# Patient Record
Sex: Male | Born: 1995 | Race: White | Hispanic: No | Marital: Single | Smoking: Current every day smoker
Health system: Southern US, Community
[De-identification: ages and names within clinical notes are randomized; demographics above are authoritative.]

## PROBLEM LIST (undated history)

## (undated) DIAGNOSIS — R1110 Vomiting, unspecified: Secondary | ICD-10-CM

## (undated) DIAGNOSIS — G2569 Other tics of organic origin: Secondary | ICD-10-CM

## (undated) DIAGNOSIS — F319 Bipolar disorder, unspecified: Secondary | ICD-10-CM

## (undated) DIAGNOSIS — G40909 Epilepsy, unspecified, not intractable, without status epilepticus: Secondary | ICD-10-CM

## (undated) HISTORY — DX: Other tics of organic origin: G25.69

## (undated) HISTORY — DX: Vomiting, unspecified: R11.10

## (undated) HISTORY — PX: CIRCUMCISION: SHX1350

---
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
## 2013-02-08 ENCOUNTER — Ambulatory Visit (INDEPENDENT_AMBULATORY_CARE_PROVIDER_SITE_OTHER): Payer: Medicaid Other | Admitting: Pediatrics

## 2013-02-08 ENCOUNTER — Encounter: Payer: Self-pay | Admitting: Pediatrics

## 2013-02-08 VITALS — BP 100/76 | HR 72 | Ht 68.25 in | Wt 114.8 lb

## 2013-02-08 DIAGNOSIS — F329 Major depressive disorder, single episode, unspecified: Secondary | ICD-10-CM

## 2013-02-08 DIAGNOSIS — F32A Depression, unspecified: Secondary | ICD-10-CM

## 2013-02-08 DIAGNOSIS — G40309 Generalized idiopathic epilepsy and epileptic syndromes, not intractable, without status epilepticus: Secondary | ICD-10-CM

## 2013-02-08 DIAGNOSIS — R269 Unspecified abnormalities of gait and mobility: Secondary | ICD-10-CM

## 2013-02-08 DIAGNOSIS — F3289 Other specified depressive episodes: Secondary | ICD-10-CM

## 2013-02-08 MED ORDER — LAMOTRIGINE 100 MG PO TABS: 200.0000 mg | ORAL_TABLET | Freq: Two times a day (BID) | ORAL | Status: DC

## 2013-02-08 MED ORDER — LEVETIRACETAM 1000 MG PO TABS: ORAL_TABLET | ORAL | Status: DC

## 2013-02-08 NOTE — Progress Notes (Signed)
Patient: Andrew Everett MRN: [id number] Sex: male DOB: 07/18/1995  Provider: Deetta Perla, MD Location of Care: [facility] Child Neurology  Note type: Routine return visit  History of Present Illness: Referral Source: Dr. Joellen Jersey. Jenne Pane History from: mother, patient and [facility] chart Chief Complaint: Tics/Epilepsy/ADHD  Andrew Ryle is a 18 y.o. male who returns for evaluation and management of motor tics, seizures, and attention deficit disorder.  The patient was seen on February 08, 2013.  His last visit was on August 08, 2012.  He has a history of generalized convulsive seizures associated with jerking of his limbs, deep breathing, eyelids open, eyes rolled up, tachycardia, teeth grinding, and vomiting.  They last 5 to 6 minutes and he has postictal stupor of 15 minutes.  His last known seizure was in early December 2013.  The patient has continued to lose weight since his last visit.  He is down another 4.2 pounds.  He said that he is no longer living with his mother and that he lives with people who are poor and there is little food to eat.  Mother lost her food stamps and so she is having difficulty making ends meet.  I had her leave the room, so that I could talk to him.  He tells me that he is depressed and beginning to get in a place where he thinks about hurting himself.  He has no plan at this time.  I urged him to go back and see his psychiatrist.  I told him, he did not need to be hospitalized.  He was very frightened of that.  He smokes about four cigarettes a day, smokes marijuana at least weekly and once a week has a potent 16-ounce alcoholic drink called Loco.  He is no longer taking Zyprexa, Depade, or Prozac, although he told me that he was.  He claims that he is taking lamotrigine and levetiracetam and said that he wanted to come off the medications.  I told him that it would be unwise and that he would likely have recurrent seizures.  He has not been educated beyond the 9th grade.   He went to [employer] classes, but was bored and quit them.  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  He is clearly not able to support himself.  He tells me that he is getting ready to move to [place].  I do not think he has any idea where he is going to go or stay.  The other big problem is that he has Medicaid here, and will have to establish residency in [place].  It is highly likely that he will go without medication and wind up having recurrent seizures.  I pointed this out to him and told him to think it through very carefully before he left.  Review of Systems: 12 system review was remarkable for trouble swallowing, increase thirst, headache, depression, anxiety and racing thoughts  Past Medical History  Diagnosis Date  . Epilepsy   . Bipolar disorder   . Vomiting   . Constipation   . Seizure disorder   . Tics of organic origin    Hospitalizations: yes, Head Injury: no, Nervous System Infections: no, Immunizations up to date: yes Past Medical History Comments: See surgical Hx for hospitalizations.  Birth History 6 lbs. 14 oz. Infant born at [redacted] weeks gestational age Gestation was uncomplicated Mother received Pitocin normal spontaneous vaginal delivery Nursery Course was complicated by by mild jaundice. Growth and Development was  recalled as  normal  Behavior History Difficult to discipline, becomes upset easily, difficulty sleeping, destructive, prefers to be alone, unusual difficulty with brothers and sisters.  Surgical History Past Surgical History  Procedure Laterality Date  . Circumcision    . Wrist surgery Left 2013    Repair left broken wrist    Family History family history includes Bipolar disorder in his mother; COPD in his maternal grandmother; Diabetes in his maternal grandmother; Hepatitis C in his maternal grandmother. Family History is negative migraines, seizures, cognitive impairment, blindness, deafness, birth defects, chromosomal  disorder, autism.  Social History History   Social History  . Marital Status: Single    Spouse Name: N/A    Number of Children: N/A  . Years of Education: N/A   Social History Main Topics  . Smoking status: Current Every Day Smoker  . Smokeless tobacco: Never Used     Comment: Patient smokes 4 cigaretts daily  . Alcohol Use: 8.0 oz/week    16 drink(s) per week     Comment: Patient had 1 alcoholic beverage in Jan. 2015.  . Drug Use: 5.00 per week    Special: Marijuana, Other-see comments     Comment: Prescription Cold Tablets- Patient stated he no longer takes cold tabs as a means of getting high. He admitted to smoking Marijuana once in Jan. 2015  . Sexual Activity: Yes    Partners: Female   Other Topics Concern  . None   Social History Narrative  . None   Educational level 9th grade Living with his girlfriend  Hobbies/Interest: Watching TV and texting on his phone. School comments [place] dropped out of school his 9th grade year, he was attending [employer].   Current Outpatient Prescriptions on File Prior to Visit  Medication Sig Dispense Refill  . lamoTRIgine (LAMICTAL) 100 MG tablet Take 2 tablets (200 mg total) by mouth 2 (two) times daily.  124 tablet  5  . levETIRAcetam (KEPPRA) 1000 MG tablet Take 2 tablets (2,000 mg total) by mouth 2 (two) times daily.  124 tablet  5   No current facility-administered medications on file prior to visit.   The medication list was reviewed and reconciled. All changes or newly prescribed medications were explained.  A complete medication list was provided to the patient/caregiver.  Allergies  Allergen Reactions  . Aripiprazole Anaphylaxis    And seizure  . Risperdal [Risperidone] Anaphylaxis  . Seroquel [Quetiapine Fumerate] Anaphylaxis    Physical Exam BP 100/76  Pulse 72  Ht 5' 8.25" (1.734 m)  Wt 114 lb 12.8 oz (52.073 kg)  BMI 17.32 kg/m2  General: alert, well developed, thin, dishevelled, in no  acute distress, dyed pink hair, brown eyes, right handed  Head: normocephalic, no dysmorphic features  Ears, Nose and Throat: Otoscopic: Tympanic membranes normal. Pharynx: oropharynx is pink without exudates or tonsillar hypertrophy.  Neck: supple, full range of motion, no cranial or cervical bruits  Respiratory: auscultation clear  Cardiovascular: no murmurs, pulses are normal  Musculoskeletal: no skeletal deformities or apparent scoliosis  Skin: no rashes or neurocutaneous lesions, significant facial acne   Neurologic Exam   Mental Status: alert; oriented to person, place and year; knowledge is normal for age; language is normal, Subdued, flat affect, poor eye contact. He was responsive when I asked him questions and cooperative during the examination.  Cranial Nerves: visual fields are full to double simultaneous stimuli; extraocular movements are full and conjugate; pupils are around reactive to light; funduscopic examination  shows sharp disc margins with normal vessels; symmetric facial strength; midline tongue and uvula; air conduction is greater than bone conduction bilaterally.  Motor: Normal strength, tone and mass; good fine motor movements; no pronator drift.  Sensory: intact responses to cold, vibration, proprioception and stereognosis  Coordination: good finger-to-nose, rapid repetitive alternating movements and finger apposition  Gait and Station: normal gait and station: patient is able to walk on heels, toes and tandem without difficulty; balance is adequate; Romberg exam is negative; Gower response is negative  Reflexes: symmetric and diminished bilaterally; no clonus; bilateral flexor plantar responses.  Assessment 1. 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 2. Gait abnormality (781.2). 3. Depression (311).  Discussion I am pleased that his seizures remain in control.  It was very difficult to get there.  I am afraid that he will do something  impulsive and that his seizures will recur.   I am concerned about his poor nutrition.  I think that he is way past beyond returning to his mother's home to stay.   Finally, I am concerned about his depression, although he has been off medication since when I last saw him and he has not had any significant evidence of suicidal gestures.   I think that his plan to move to [place] is ill-conceived.  In his attempt to find a new life, I am afraid that he is going to find the old life, which was certainly problematic and is better because of the financial and emotional supports that he has here.  If he remains, I will see him in six months' time, sooner depending upon clinical need.    I spent 30 minutes of face-to-face time with Andrew and his mother, more than half of it in consultation.  Deetta Perla MD

## 2013-12-26 ENCOUNTER — Encounter (HOSPITAL_COMMUNITY): Payer: Self-pay | Admitting: Emergency Medicine

## 2013-12-26 ENCOUNTER — Emergency Department (INDEPENDENT_AMBULATORY_CARE_PROVIDER_SITE_OTHER)
Admission: EM | Admit: 2013-12-26 | Discharge: 2013-12-26 | Disposition: A | Discharge status: Home / Self Care | Payer: Medicaid Other | Source: Home / Self Care | Attending: Family Medicine | Admitting: Family Medicine

## 2013-12-26 DIAGNOSIS — J020 Streptococcal pharyngitis: Secondary | ICD-10-CM

## 2013-12-26 MED ORDER — AMOXICILLIN 500 MG PO CAPS: 1000.0000 mg | ORAL_CAPSULE | Freq: Two times a day (BID) | ORAL | Status: DC

## 2013-12-26 MED ORDER — ACETAMINOPHEN 325 MG PO TABS
ORAL_TABLET | ORAL | Status: AC
  Filled 2013-12-26: qty 3

## 2013-12-26 MED ORDER — ACETAMINOPHEN 325 MG PO TABS
975.0000 mg | ORAL_TABLET | Freq: Once | ORAL | Status: AC
  Administered 2013-12-26: 975 mg via ORAL

## 2013-12-26 NOTE — ED Provider Notes (Signed)
[place] Keidel is a 18 y.o. male who presents to Urgent Care today for sore throat. Patient has significant sore throat and fever. Symptoms started today. No vomiting cough congestion chest pains or palpitations. Patient has tried Robitussin which helps some.   Past Medical History  Diagnosis Date  . Epilepsy   . Bipolar disorder   . Vomiting   . Constipation   . Seizure disorder   . Tics of organic origin    Past Surgical History  Procedure Laterality Date  . Circumcision    . Wrist surgery Left 2013    Repair left broken wrist   History  Substance Use Topics  . Smoking status: Current Every Day Smoker  . Smokeless tobacco: Never Used     Comment: Patient smokes 4 cigaretts daily  . Alcohol Use: 8.0 oz/week    16 drink(s) per week     Comment: Patient had 1 alcoholic beverage in Jan. 2015.   ROS as above Medications: No current facility-administered medications for this encounter.   Current Outpatient Prescriptions  Medication Sig Dispense Refill  . amoxicillin (AMOXIL) 500 MG capsule Take 2 capsules (1,000 mg total) by mouth 2 (two) times daily. 40 capsule 0  . lamoTRIgine (LAMICTAL) 100 MG tablet Take 2 tablets (200 mg total) by mouth 2 (two) times daily. 124 tablet 5  . levETIRAcetam (KEPPRA) 1000 MG tablet 2 by mouth twice a day 124 tablet 5   Allergies  Allergen Reactions  . Aripiprazole Anaphylaxis    And seizure  . Risperdal [Risperidone] Anaphylaxis  . Seroquel [Quetiapine Fumerate] Anaphylaxis     Exam:  BP 166/62 mmHg  Pulse 90  Temp(Src) 101 F (38.3 C) (Oral)  Resp 18  SpO2 95% Gen: Well NAD nontoxic appearing HEENT: EOMI,  MMM posterior pharynx is erythematous with exudate. Normal tympanic membranes bilaterally. Lungs: Normal work of breathing. CTABL Heart: RRR no MRG Abd: NABS, Soft. Nondistended, Nontender Exts: Brisk capillary refill, warm and well perfused.   No results found for this or any previous visit (from the past 24 hour(s)). No  results found.  Assessment and Plan: 18 y.o. male with strep throat. Treatment with amoxicillin. Continue Tylenol or ibuprofen as needed.  Discussed warning signs or symptoms. Please see discharge instructions. Patient expresses understanding.     Rodolph BongEvan S Amato Sevillano, MD 12/26/13 [phone]

## 2013-12-26 NOTE — Discharge Instructions (Signed)
Thank you for coming in today. Take tylenol for pain and fever.  Strep Throat Strep throat is an infection of the throat caused by a bacteria named Streptococcus pyogenes. Your health care provider may call the infection streptococcal "tonsillitis" or "pharyngitis" depending on whether there are signs of inflammation in the tonsils or back of the throat. Strep throat is most common in children aged 20-15 years during the cold months of the year, but it can occur in people of any age during any season. This infection is spread from person to person (contagious) through coughing, sneezing, or other close contact. SIGNS AND SYMPTOMS   Fever or chills.  Painful, swollen, red tonsils or throat.  Pain or difficulty when swallowing.  White or yellow spots on the tonsils or throat.  Swollen, tender lymph nodes or "glands" of the neck or under the jaw.  Red rash all over the body (rare). DIAGNOSIS  Many different infections can cause the same symptoms. A test must be done to confirm the diagnosis so the right treatment can be given. A "rapid strep test" can help your health care provider make the diagnosis in a few minutes. If this test is not available, a light swab of the infected area can be used for a throat culture test. If a throat culture test is done, results are usually available in a day or two. TREATMENT  Strep throat is treated with antibiotic medicine. HOME CARE INSTRUCTIONS   Gargle with 1 tsp of salt in 1 cup of warm water, 3-4 times per day or as needed for comfort.  Family members who also have a sore throat or fever should be tested for strep throat and treated with antibiotics if they have the strep infection.  Make sure everyone in your household washes their hands well.  Do not share food, drinking cups, or personal items that could cause the infection to spread to others.  You may need to eat a soft food diet until your sore throat gets better.  Drink enough water and  fluids to keep your urine clear or pale yellow. This will help prevent dehydration.  Get plenty of rest.  Stay home from school, day care, or work until you have been on antibiotics for 24 hours.  Take medicines only as directed by your health care provider.  Take your antibiotic medicine as directed by your health care provider. Finish it even if you start to feel better. SEEK MEDICAL CARE IF:   The glands in your neck continue to enlarge.  You develop a rash, cough, or earache.  You cough up green, yellow-brown, or bloody sputum.  You have pain or discomfort not controlled by medicines.  Your problems seem to be getting worse rather than better.  You have a fever. SEEK IMMEDIATE MEDICAL CARE IF:   You develop any new symptoms such as vomiting, severe headache, stiff or painful neck, chest pain, shortness of breath, or trouble swallowing.  You develop severe throat pain, drooling, or changes in your voice.  You develop swelling of the neck, or the skin on the neck becomes red and tender.  You develop signs of dehydration, such as fatigue, dry mouth, and decreased urination.  You become increasingly sleepy, or you cannot wake up completely. MAKE SURE YOU:  Understand these instructions.  Will watch your condition.  Will get help right away if you are not doing well or get worse. Document Released: 01/10/2000 Document Revised: 05/29/2013 Document Reviewed: 03/13/2010 Exit[employer] Patient Information 2015 [employer]. This information is not intended to replace advice given to you by your health care provider. Make sure you discuss any questions you have with your health care provider.

## 2013-12-26 NOTE — ED Notes (Signed)
Pt states that he has had a sore throat since this morning with some ear pain. Pt is in no acute distress at this time.

## 2014-03-05 ENCOUNTER — Telehealth: Payer: Self-pay

## 2014-03-05 NOTE — Telephone Encounter (Addendum)
Mother called, scheduled appt w Dr. Rexene EdisonH for 03/16/14 @ 9:30 am. Mother could not tell me which medication he is currently taking. [place] use [facility] [place]. She said that he takes 2 tabs in the morning and 1 in the evening. Andrew Everett has not been ill or missed any medication doses that she is aware of. Andrew Everett had 2 szs within the past 2 weeks. The most recent sz was last night. She was able to capture it on video. He was sitting and watching television with mother. Sz lasted 3 mins and included bilateral arms/upper torso jerking, eyes rolled into the back of his head and he was unresponsive.  After the sz, he was "walking around the house aimlessly" and was nonresponsive. Ten minutes after the sz ended, he experienced headache and vomited once. He then continued watching television with mother. Andrew Everett is aware that Dr.H is not in the office today. I let her know that she could expect a call back from one of the other providers in the office. She expressed understanding and can be reached at [phone].

## 2014-03-05 NOTE — Telephone Encounter (Signed)
Andrew Everett, mom, lvm stating that Andrew Everett has been having an increase in sz activity for several weeks. She said that she would like to schedule a follow up with Dr. Rexene EdisonH for Andrew Everett. She would like a Friday appt so that she can bring him.  I tried calling mother back at the number she [phone]. I lvm asking that she call me back, gave her my extension.

## 2014-03-05 NOTE — Telephone Encounter (Signed)
I have attempted to call Mom several times this afternoon but each time received a fast busy signal. In reviewing Andrew Everett's chart, it does not appear that the medication doses that Mom quoted to Tammy (below) is correct, nor does it appear that his medication has been refilled since January 2015. I will try to call Mom again tomorrow. TG

## 2014-03-06 NOTE — Telephone Encounter (Signed)
I have tried to call Mom again today and continue to receive a fast busy signal when I call. I will mail a letter. TG

## 2014-03-07 NOTE — Telephone Encounter (Signed)
Thank you, I agree with this plan.

## 2014-03-16 ENCOUNTER — Ambulatory Visit (INDEPENDENT_AMBULATORY_CARE_PROVIDER_SITE_OTHER): Payer: Medicaid Other | Admitting: Pediatrics

## 2014-03-16 ENCOUNTER — Encounter: Payer: Self-pay | Admitting: Pediatrics

## 2014-03-16 VITALS — BP 109/70 | HR 90 | Ht 68.5 in | Wt 117.2 lb

## 2014-03-16 DIAGNOSIS — R269 Unspecified abnormalities of gait and mobility: Secondary | ICD-10-CM

## 2014-03-16 DIAGNOSIS — G40309 Generalized idiopathic epilepsy and epileptic syndromes, not intractable, without status epilepticus: Secondary | ICD-10-CM

## 2014-03-16 DIAGNOSIS — F32A Depression, unspecified: Secondary | ICD-10-CM

## 2014-03-16 DIAGNOSIS — F329 Major depressive disorder, single episode, unspecified: Secondary | ICD-10-CM

## 2014-03-16 MED ORDER — LEVETIRACETAM 1000 MG PO TABS: ORAL_TABLET | ORAL | Status: DC

## 2014-03-16 MED ORDER — LAMOTRIGINE 100 MG PO TABS: 200.0000 mg | ORAL_TABLET | Freq: Two times a day (BID) | ORAL | Status: DC
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
## 2014-04-23 IMAGING — CR DG ABDOMEN 1V
1 series · 1 of 1 positions shown · non-contrast
Comparison: 04/09/2011

CLINICAL DATA: Left lower abdominal pain.  Irregular bowel
movements.

ABDOMEN - 1 VIEW

[t abdomen supine]
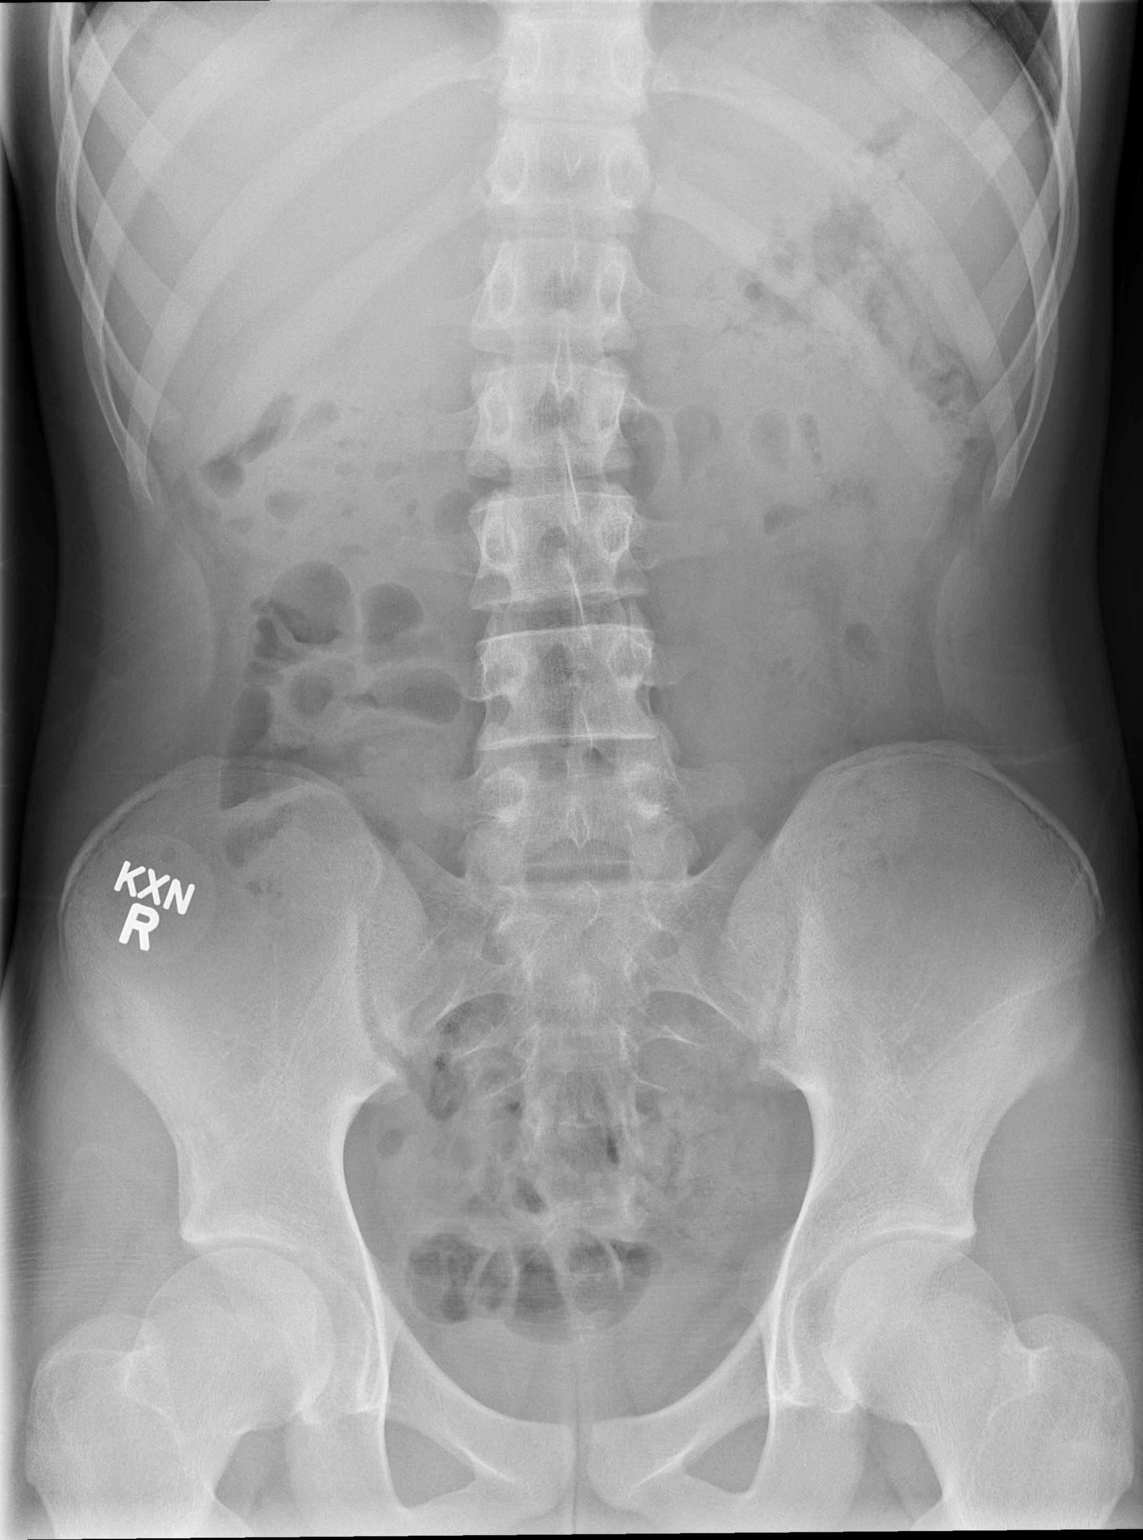

[1 of 1 positions shown; findings below may reference images not displayed]

FINDINGS: Bowel gas pattern is normal without evidence of ileus or
obstruction.  Normal amount of fecal matter.  No worrisome
calcifications.  Minimal spinal curvature.
IMPRESSION: No evidence of ileus, obstruction or constipation by radiography.
Mild spinal curvature.

## 2014-11-01 ENCOUNTER — Encounter (HOSPITAL_COMMUNITY): Payer: Self-pay | Admitting: *Deleted

## 2014-11-01 ENCOUNTER — Emergency Department (INDEPENDENT_AMBULATORY_CARE_PROVIDER_SITE_OTHER)
Admission: EM | Admit: 2014-11-01 | Discharge: 2014-11-01 | Disposition: A | Discharge status: Home / Self Care | Payer: Medicaid Other | Source: Home / Self Care | Attending: Family Medicine | Admitting: Family Medicine

## 2014-11-01 DIAGNOSIS — L0100 Impetigo, unspecified: Secondary | ICD-10-CM | POA: Diagnosis not present

## 2014-11-01 DIAGNOSIS — L200 Besnier's prurigo: Secondary | ICD-10-CM | POA: Diagnosis not present

## 2014-11-01 DIAGNOSIS — L239 Allergic contact dermatitis, unspecified cause: Secondary | ICD-10-CM

## 2014-11-01 MED ORDER — PREDNISONE 10 MG (48) PO TBPK: ORAL_TABLET | Freq: Every day | ORAL | Status: DC

## 2014-11-01 MED ORDER — MUPIROCIN 2 % EX OINT: 1.0000 "application " | TOPICAL_OINTMENT | Freq: Two times a day (BID) | CUTANEOUS | Status: DC

## 2014-11-01 NOTE — ED Provider Notes (Addendum)
CSN: [id number]     Arrival date & time 11/01/14  1425 History   First MD Initiated Contact with Patient 11/01/14 1548     Chief Complaint  Patient presents with  . Rash   (Consider location/radiation/quality/duration/timing/severity/associated sxs/prior Treatment) HPI  Rash developed 7 days ago. Sleeps a station in house. Also bombed a couple days prior to onset of symptoms. Patient had direct visual contact with fleas biting his legs. Very initially itchy. Patient has scratch the area which is made the problem worse. Patient has tried over-the-counter cortisone cream without improvement. Some pain with open sores. Crusting of the skin. Problem is constant and getting worse.    Past Medical History  Diagnosis Date  . Epilepsy (HCC)   . Bipolar disorder (HCC)   . Vomiting   . Constipation   . Seizure disorder (HCC)   . Tics of organic origin    Past Surgical History  Procedure Laterality Date  . Circumcision    . Wrist surgery Left 2013    Repair left broken wrist   Family History  Problem Relation Age of Onset  . Bipolar disorder Mother   . Diabetes Maternal Grandmother   . Hepatitis C Maternal Grandmother   . COPD Maternal Grandmother    Social History  Substance Use Topics  . Smoking status: Current Every Day Smoker    Types: Cigarettes  . Smokeless tobacco: Never Used     Comment: Patient smokes 4 cigaretts daily  . Alcohol Use: No     Comment: Patient had 1 alcoholic beverage in Jan. 2015.    Review of Systems Per HPI with all other pertinent systems negative.   Allergies  Aripiprazole; Risperdal; and Seroquel  Home Medications   Prior to Admission medications   Medication Sig Start Date End Date Taking? Authorizing Provider  lamoTRIgine (LAMICTAL) 100 MG tablet Take 2 tablets (200 mg total) by mouth 2 (two) times daily. 03/16/14  Yes Deetta Perla, MD  levETIRAcetam (KEPPRA) 1000 MG tablet 2 by mouth twice a day 03/16/14  Yes Deetta Perla, MD   mupirocin ointment (BACTROBAN) 2 % Apply 1 application topically 2 (two) times daily. 11/01/14   Ozella Rocks, MD  predniSONE (STERAPRED UNI-PAK 48 TAB) 10 MG (48) TBPK tablet Take by mouth daily. Take as instructed 11/01/14   Ozella Rocks, MD   Meds Ordered and Administered this Visit  Medications - No data to display  BP 118/74 mmHg  Pulse 79  Temp(Src) 98.3 F (36.8 C) (Oral)  Resp 16  SpO2 99% No data found.   Physical Exam Physical Exam  Constitutional: oriented to person, place, and time. appears well-developed and well-nourished. No distress.  HENT:  Head: Normocephalic and atraumatic.  Eyes: EOMI. PERRL.  Neck: Normal range of motion.  Cardiovascular: RRR, no m/r/g, 2+ distal pulses,  Pulmonary/Chest: Effort normal and breath sounds normal. No respiratory distress.  Abdominal: Soft. Bowel sounds are normal. NonTTP, no distension.  Musculoskeletal: Normal range of motion. Non ttp, no effusion.  Neurological: alert and oriented to person, place, and time.  Skin: Lower extremity erythematous, macular, papular lesions with areas of crusting.  Psychiatric: normal mood and affect. behavior is normal. Judgment and thought content normal.   ED Course  Procedures (including critical care time)  Labs Review Labs Reviewed - No data to display  Imaging Review No results found.   Visual Acuity Review  Right Eye Distance:   Left Eye Distance:   Bilateral Distance:  Right Eye Near:   Left Eye Near:    Bilateral Near:         MDM   1. Allergic dermatitis   2. Impetigo    Prednisone dose pack, bactroban ointment.    Ozella Rocks, MD 11/01/14 1616  Ozella Rocks, MD 11/01/14 [phone]

## 2014-11-01 NOTE — ED Notes (Signed)
Red, raised itchy rash on left lower leg

## 2014-11-01 NOTE — Discharge Instructions (Signed)
You have a mild bacterial infection of the skin. This is caused by breakdown of the skin from irritation and inflammation. Please use the steroids as prescribed to help with the allergic reaction and use the ointment as prescribed for the infection. Please call if symptoms get worse.

## 2015-01-04 ENCOUNTER — Emergency Department (HOSPITAL_COMMUNITY)
Admission: EM | Admit: 2015-01-04 | Discharge: 2015-01-05 | Disposition: A | Discharge status: Home / Self Care | Payer: Medicaid Other | Attending: Emergency Medicine | Admitting: Emergency Medicine

## 2015-01-04 ENCOUNTER — Encounter (HOSPITAL_COMMUNITY): Payer: Self-pay | Admitting: Adult Health

## 2015-01-04 DIAGNOSIS — G40909 Epilepsy, unspecified, not intractable, without status epilepticus: Secondary | ICD-10-CM | POA: Diagnosis not present

## 2015-01-04 DIAGNOSIS — F319 Bipolar disorder, unspecified: Secondary | ICD-10-CM | POA: Diagnosis not present

## 2015-01-04 DIAGNOSIS — N50812 Left testicular pain: Secondary | ICD-10-CM

## 2015-01-04 DIAGNOSIS — N451 Epididymitis: Secondary | ICD-10-CM | POA: Insufficient documentation

## 2015-01-04 DIAGNOSIS — Z79899 Other long term (current) drug therapy: Secondary | ICD-10-CM | POA: Diagnosis not present

## 2015-01-04 DIAGNOSIS — N5089 Other specified disorders of the male genital organs: Secondary | ICD-10-CM

## 2015-01-04 DIAGNOSIS — F17210 Nicotine dependence, cigarettes, uncomplicated: Secondary | ICD-10-CM | POA: Diagnosis not present

## 2015-01-04 DIAGNOSIS — K920 Hematemesis: Secondary | ICD-10-CM | POA: Insufficient documentation

## 2015-01-04 NOTE — ED Notes (Signed)
Presents with hematemesis for 2 weeks, intermittently. Also c/o testicular pain for one month and left sided lump in testicle. Endorses yellow ejaculation. Endorses pain with urination.

## 2015-01-05 ENCOUNTER — Emergency Department (HOSPITAL_COMMUNITY): Payer: Medicaid Other

## 2015-01-05 LAB — URINALYSIS, ROUTINE W REFLEX MICROSCOPIC
Bilirubin Urine: NEGATIVE
Glucose, UA: NEGATIVE mg/dL
Hgb urine dipstick: NEGATIVE
KETONES UR: NEGATIVE mg/dL
LEUKOCYTES UA: NEGATIVE
Nitrite: NEGATIVE
Protein, ur: NEGATIVE mg/dL
Specific Gravity, Urine: 1.02 (ref 1.005–1.030)
pH: 7 (ref 5.0–8.0)

## 2015-01-05 LAB — I-STAT CHEM 8, ED
BUN: 5 mg/dL — ABNORMAL LOW (ref 6–20)
CHLORIDE: 99 mmol/L — AB (ref 101–111)
Calcium, Ion: 1.13 mmol/L (ref 1.12–1.23)
Creatinine, Ser: 1 mg/dL (ref 0.61–1.24)
GLUCOSE: 129 mg/dL — AB (ref 65–99)
HEMATOCRIT: 46 % (ref 39.0–52.0)
Hemoglobin: 15.6 g/dL (ref 13.0–17.0)
POTASSIUM: 3.9 mmol/L (ref 3.5–5.1)
SODIUM: 140 mmol/L (ref 135–145)
TCO2: 29 mmol/L (ref 0–100)

## 2015-01-05 LAB — POC OCCULT BLOOD, ED: Fecal Occult Bld: NEGATIVE

## 2015-01-05 MED ORDER — LEVOFLOXACIN 500 MG PO TABS
500.0000 mg | ORAL_TABLET | Freq: Once | ORAL | Status: AC
  Administered 2015-01-05: 500 mg via ORAL
  Filled 2015-01-05: qty 1

## 2015-01-05 MED ORDER — HYDROCODONE-ACETAMINOPHEN 5-325 MG PO TABS
2.0000 | ORAL_TABLET | Freq: Once | ORAL | Status: AC
Start: 2015-01-05 — End: 2015-01-05
  Administered 2015-01-05: 2 via ORAL
  Filled 2015-01-05: qty 2

## 2015-01-05 MED ORDER — CEFTRIAXONE SODIUM 250 MG IJ SOLR
250.0000 mg | Freq: Once | INTRAMUSCULAR | Status: AC
  Administered 2015-01-05: 250 mg via INTRAMUSCULAR
  Filled 2015-01-05: qty 250

## 2015-01-05 MED ORDER — PANTOPRAZOLE SODIUM 40 MG PO TBEC
40.0000 mg | DELAYED_RELEASE_TABLET | Freq: Once | ORAL | Status: AC
  Administered 2015-01-05: 40 mg via ORAL
  Filled 2015-01-05: qty 1

## 2015-01-05 MED ORDER — DOXYCYCLINE HYCLATE 100 MG PO TABS: 100.0000 mg | ORAL_TABLET | Freq: Once | ORAL | Status: DC

## 2015-01-05 MED ORDER — LEVOFLOXACIN 500 MG PO TABS: 500.0000 mg | ORAL_TABLET | Freq: Every day | ORAL | Status: DC

## 2015-01-05 MED ORDER — PROMETHAZINE HCL 25 MG PO TABS: 25.0000 mg | ORAL_TABLET | Freq: Four times a day (QID) | ORAL | Status: AC | PRN

## 2015-01-05 MED ORDER — HYDROCODONE-ACETAMINOPHEN 5-325 MG PO TABS: 1.0000 | ORAL_TABLET | ORAL | Status: DC | PRN

## 2015-01-05 MED ORDER — PANTOPRAZOLE SODIUM 40 MG PO TBEC: 40.0000 mg | DELAYED_RELEASE_TABLET | Freq: Every day | ORAL | Status: DC

## 2015-01-05 MED ORDER — LIDOCAINE HCL (PF) 1 % IJ SOLN
INTRAMUSCULAR | Status: AC
  Filled 2015-01-05: qty 5

## 2015-01-05 NOTE — Discharge Instructions (Signed)
Please stop drinking alcohol and avoid NSAIDs such as aspirin, ibuprofen, Aleve. I recommend that if you continue to have episodes or you have blood strict vomit that you follow-up with a gastroenterologist as you will likely need an endoscopy. Please call this weekend schedule an appointment. Also were found to have epididymitis which is an infection and could be related to sexual transmitted disease. We have tested you for gonorrhea, chlamydia, HIV and syphilis. If any of these tests are positive you will be contacted and you and your partner will need to be treated. We are discharging you on antibiotics and pain medication for your epididymitis.   Hematemesis Hematemesis is when you vomit blood. It is a sign of bleeding in the upper part of your digestive tract. This is also called your gastrointestinal (GI) tract. Your upper GI tract includes your mouth, throat, esophagus, stomach, and the first part of your small intestine (duodenum).  Hematemesis is usually caused by bleeding from your esophagus or stomach. You may suddenly vomit bright red blood. You might also vomit old blood. It may look like coffee grounds. You may also have other symptoms, such as:  Stomach pain.  Heartburn.  Black and tarry stool.  HOME CARE INSTRUCTIONS  Watch your hematemesis for any changes. The following actions may help to lessen any discomfort you are feeling:  Take medicines only as directed by your health care provider. Do not take aspirin, ibuprofen, or any other anti-inflammatory medicine without approval from your health care provider.  Rest as needed.  Drink small sips of clear liquids often, as long as you can keep them down. Try to drink enough fluids to keep your urine clear or pale yellow.  Do not drink alcohol.  Do not use any tobacco products, including cigarettes, chewing tobacco, or electronic cigarettes. If you need help quitting, ask your health care provider.  Keep all follow-up visits as  directed by your health care provider. This is important. SEEK MEDICAL CARE IF:   The vomiting of blood worsens, or begins again after it has stopped.  You have persistent stomach pain.  You have nausea, indigestion, or heartburn.  You feel weak or dizzy. SEEK IMMEDIATE MEDICAL CARE IF:   You faint or feel extremely weak.  You have a rapid heartbeat.  You are urinating less than normal or not at all.  You have persistent vomiting.  You vomit large amounts of bloody or dark material.  You vomit bright red blood.  You pass large, dark, or bloody stools.  You have chest pain or trouble breathing.   This information is not intended to replace advice given to you by your health care provider. Make sure you discuss any questions you have with your health care provider.   Document Released: 02/20/2004 Document Revised: 02/02/2014 Document Reviewed: 09/06/2013 Elsevier Interactive Patient Education 2016 Elsevier Inc.  Epididymitis Epididymitis is swelling (inflammation) of the epididymis. The epididymis is a cord-like structure that is located along the top and back part of the testicle. It collects and stores sperm from the testicle. This condition can also cause pain and swelling of the testicle and scrotum. Symptoms usually start suddenly (acute epididymitis). Sometimes epididymitis starts gradually and lasts for a while (chronic epididymitis). This type may be harder to treat. CAUSES In men 5835 and younger, this condition is usually caused by a bacterial infection or sexually transmitted disease (STD), such as:  Gonorrhea.  Chlamydia.  In men 1835 and older who do not have anal sex, this  condition is usually caused by bacteria from a blockage or abnormalities in the urinary system. These can result from:  Having a tube placed into the bladder (urinary catheter).  Having an enlarged or inflamed prostate gland.  Having recent urinary tract surgery. In men who have a  condition that weakens the body's defense system (immune system), such as HIV, this condition can be caused by:   Other bacteria, including tuberculosis and syphilis.  Viruses.  Fungi. Sometimes this condition occurs without infection. That may happen if urine flows backward into the epididymis after heavy lifting or straining. RISK FACTORS This condition is more likely to develop in men:  Who have unprotected sex with more than one partner.  Who have anal sex.   Who have recently had surgery.   Who have a urinary catheter.  Who have urinary problems.  Who have a suppressed immune system. SYMPTOMS  This condition usually begins suddenly with chills, fever, and pain behind the scrotum and in the testicle. Other symptoms include:   Swelling of the scrotum, testicle, or both.  Pain whenejaculatingor urinating.  Pain in the back or belly.  Nausea.  Itching and discharge from the penis.  Frequent need to pass urine.  Redness and tenderness of the scrotum. DIAGNOSIS Your health care provider can diagnose this condition based on your symptoms and medical history. Your health care provider will also do a physical exam to ask about your symptoms and check your scrotum and testicle for swelling, pain, and redness. You may also have other tests, including:   Examination of discharge from the penis.  Urine tests for infections, such as STDs.  Your health care provider may test you for other STDs, including HIV. TREATMENT Treatment for this condition depends on the cause. If your condition is caused by a bacterial infection, oral antibiotic medicine may be prescribed. If the bacterial infection has spread to your blood, you may need to receive IV antibiotics. Nonbacterial epididymitis is treated with home care that includes bed rest and elevation of the scrotum. Surgery may be needed to treat:  Bacterial epididymitis that causes pus to build up in the scrotum  (abscess).  Chronic epididymitis that has not responded to other treatments. HOME CARE INSTRUCTIONS Medicines  Take over-the-counter and prescription medicines only as told by your health care provider.   If you were prescribed an antibiotic medicine, take it as told by your health care provider. Do not stop taking the antibiotic even if your condition improves. Sexual Activity  If your epididymitis was caused by an STD, avoid sexual activity until your treatment is complete.  Inform your sexual partner or partners if you test positive for an STD. They may need to be treated.Do not engage in sexual activity with your partner or partners until their treatment is completed. General Instructions  Return to your normal activities as told by your health care provider. Ask your health care provider what activities are safe for you.  Keep your scrotum elevated and supported while resting. Ask your health care provider if you should wear a scrotal support, such as a jockstrap. Wear it as told by your health care provider.  If directed, apply ice to the affected area:   Put ice in a plastic bag.  Place a towel between your skin and the bag.  Leave the ice on for 20 minutes, 2-3 times per day.  Try taking a sitz bath to help with discomfort. This is a warm water bath that is taken while  you are sitting down. The water should only come up to your hips and should cover your buttocks. Do this 3-4 times per day or as told by your health care provider.  Keep all follow-up visits as told by your health care provider. This is important. SEEK MEDICAL CARE IF:   You have a fever.   Your pain medicine is not helping.   Your pain is getting worse.   Your symptoms do not improve within three days.   This information is not intended to replace advice given to you by your health care provider. Make sure you discuss any questions you have with your health care provider.   Document  Released: 01/10/2000 Document Revised: 10/03/2014 Document Reviewed: 05/30/2014 Elsevier Interactive Patient Education [employer]

## 2015-01-05 NOTE — ED Provider Notes (Signed)
By signing my name below, I, Freida BusmanDiana Omoyeni, attest that this documentation has been prepared under the direction and in the presence of Milik Gilreath N Kelvin Sennett, DO . Electronically Signed: Freida Busmaniana Omoyeni, Scribe. 01/05/2015. 1:37 AM.  TIME SEEN: 12:41 AM  CHIEF COMPLAINT: Hematemesis  HPI:  HPI Comments:  Andrew Everett is a 19 y.o. male who presents to the Emergency Department complaining of intermittent episodes of hematemesis for ~ 2 weeks. He describes dark red coloration of his emesis today; notes he ate fish sticks for dinner.  No clots. No coffee-ground emesis. He denies blood in stool, melena, fever, cough, and abdominal pain. He also denies h/o hematemesis, h/o abdominal surgery, and h/o endoscopy. Pt drinks ETOH daily. He denies regular ingestion of NSAIDs.   He also complains of left testicular pain for ~ 1 month, notes increased pain today with ambulation. He also describes a lump to his left testicle and reports associated dysuria and yellow ejaculate. Pt is sexually active with more than one male partner; denies use of protection. Has had vaginal and anal sex. He has a h/o UTI; states episode today is dissimilar. No alleviating factors noted. No flank pain.  No PCP or GI.   ROS: See HPI Constitutional: no fever  Eyes: no drainage  ENT: no runny nose   Cardiovascular:  no chest pain  Resp: no SOB  GI: no vomiting GU: dysuria Integumentary: no rash  Allergy: no hives  Musculoskeletal: no leg swelling  Neurological: no slurred speech ROS otherwise negative  PAST MEDICAL HISTORY/PAST SURGICAL HISTORY:  Past Medical History  Diagnosis Date  . Epilepsy (HCC)   . Bipolar disorder (HCC)   . Vomiting   . Constipation   . Seizure disorder (HCC)   . Tics of organic origin     MEDICATIONS:  Prior to Admission medications   Medication Sig Start Date End Date Taking? Authorizing Provider  lamoTRIgine (LAMICTAL) 100 MG tablet Take 2 tablets (200 mg total) by mouth 2 (two) times daily.  03/16/14  Yes Deetta PerlaWilliam H Hickling, MD  levETIRAcetam (KEPPRA) 1000 MG tablet 2 by mouth twice a day 03/16/14  Yes Deetta PerlaWilliam H Hickling, MD  Pseudoeph-Doxylamine-DM-APAP (NYQUIL PO) Take 30 mLs by mouth at bedtime as needed (cold/sleep).   Yes Historical Provider, MD    ALLERGIES:  Allergies  Allergen Reactions  . Aripiprazole Anaphylaxis    And seizure  . Risperdal [Risperidone] Anaphylaxis  . Seroquel [Quetiapine Fumerate] Anaphylaxis    SOCIAL HISTORY:  Social History  Substance Use Topics  . Smoking status: Current Every Day Smoker    Types: Cigarettes  . Smokeless tobacco: Never Used     Comment: Patient smokes 4 cigaretts daily  . Alcohol Use: No     Comment: Patient had 1 alcoholic beverage in Jan. 2015.    FAMILY HISTORY: Family History  Problem Relation Age of Onset  . Bipolar disorder Mother   . Diabetes Maternal Grandmother   . Hepatitis C Maternal Grandmother   . COPD Maternal Grandmother     EXAM: BP 131/85 mmHg  Pulse 88  Temp(Src) 98.2 F (36.8 C) (Oral)  Resp 18  SpO2 100% CONSTITUTIONAL: Alert and oriented and responds appropriately to questions. Well-appearing; well-nourished, afebrile, no distress, nontoxic HEAD: Normocephalic EYES: Conjunctivae clear, PERRL ENT: normal nose; no rhinorrhea; moist mucous membranes; pharynx without lesions noted, no lesions noted in the posterior oropharynx NECK: Supple, no meningismus, no LAD  CARD: RRR; S1 and S2 appreciated; no murmurs, no clicks, no rubs, no gallops RESP:  Normal chest excursion without splinting or tachypnea; breath sounds clear and equal bilaterally; no wheezes, no rhonchi, no rales, no hypoxia or respiratory distress, speaking full sentences ABD/GI: Normal bowel sounds; non-distended; soft, non-tender, no rebound, no guarding, no peritoneal signs GU:  Normal external genitalia, circumcised male, normal penile shaft, no blood or discharge at the urethral meatus, no testicular masses or tenderness on  the right side, left testicle is tender to palpation with a movable tender mass underneath the testicle, no scrotal masses or swelling, no hernias appreciated, 2+ femoral pulses bilaterally; no perineal erythema, warmth, subcutaneous air or crepitus; no high riding testicle, normal bilateral cremasteric reflex RECTAL:  Normal rectal tone, no gross blood or melena, guaiac negative, no hemorrhoids appreciated, nontender rectal exam BACK:  The back appears normal and is non-tender to palpation, there is no CVA tenderness EXT: Normal ROM in all joints; non-tender to palpation; no edema; normal capillary refill; no cyanosis, no calf tenderness or swelling    SKIN: Normal color for age and race; warm NEURO: Moves all extremities equally, sensation to light touch intact diffusely, cranial nerves II through XII intact PSYCH: The patient's mood and manner are appropriate. Grooming and personal hygiene are appropriate.  MEDICAL DECISION MAKING: Patient here with 2 separate complaints. He reports he has noticed that his vomit has been red intermittently over the past 2 weeks. He states he does drink alcohol regularly but no NSAIDs. No abdominal pain on exam. No melena. He is guaiac negative. Has not vomited in several hours. We'll give Protonix and check labs. Abdominal exam is completely benign.  Patient also with complaints of yellow ejaculate, testicular mass and pain. Concern for epididymitis. No sign of torsion on exam. Will obtain scrotal ultrasound. We'll also check for STDs and send urinalysis.  ED PROGRESS: Patient's labs are unremarkable. Hemoglobin is normal at 15.6. Repeat abdominal exam is still benign. He is Hemoccult negative. He is eating crackers and peanut butter in the room and drinking without any difficulty and no vomiting.  Patient's ultrasound shows epididymitis. No torsion. Urine shows no sign of infection. He has been given ceftriaxone tenderness 50 mg IM and discharged on Levaquin for  his epididymitis.    I have recommended that he follow-up with a primary care physician as well as a urologist if his testicular pain and swelling continues. I also recommended he avoid alcohol, NSAIDs and follow-up with a gastroenterologist for his complaints of intermittent hematemesis. I do not feel this time he needs an emergent scope given he is not vomiting and his abdominal exam is benign. He has no melena on exam. It is difficult to ascertain from patient if he is truly having hematemesis or if he sees red discoloration in his vomit. Discussed with him return precautions. He verbalized understanding and discomfort with this plan. We'll discharge patient on Protonix.     I personally performed the services described in this documentation, which was scribed in my presence. The recorded information has been reviewed and is accurate.    Layla Maw Maille Halliwell, DO 01/05/15 [phone]

## 2015-01-06 LAB — RPR: RPR: NONREACTIVE

## 2015-01-06 LAB — HIV ANTIBODY (ROUTINE TESTING W REFLEX): HIV Screen 4th Generation wRfx: NONREACTIVE

## 2015-01-07 LAB — GC/CHLAMYDIA PROBE AMP (~~LOC~~) NOT AT ARMC
Chlamydia: POSITIVE — AB
NEISSERIA GONORRHEA: NEGATIVE

## 2015-01-08 ENCOUNTER — Telehealth (HOSPITAL_COMMUNITY): Payer: Self-pay

## 2015-01-08 NOTE — Telephone Encounter (Signed)
Positive for chlamydia. Chart sent to EDP office for review.  

## 2015-01-12 ENCOUNTER — Telehealth (HOSPITAL_COMMUNITY): Payer: Self-pay

## 2015-01-12 NOTE — Telephone Encounter (Signed)
Chart returned from ED. Reviewed by Tilden FossaElizabeth Rees. Orders given for doxycycline 100mg  po bid x10 days.. Unable to reach by telephone. Letter sent to address on record.

## 2015-01-17 ENCOUNTER — Telehealth (HOSPITAL_BASED_OUTPATIENT_CLINIC_OR_DEPARTMENT_OTHER): Payer: Self-pay | Admitting: Emergency Medicine

## 2015-01-29 ENCOUNTER — Telehealth (HOSPITAL_COMMUNITY): Payer: Self-pay

## 2015-01-29 ENCOUNTER — Ambulatory Visit: Payer: Self-pay | Admitting: Family Medicine

## 2015-01-29 NOTE — Telephone Encounter (Addendum)
Pt returned call 12/22 and Rx called in to pharmacy.  DHHS form completed and faxed

## 2015-02-04 ENCOUNTER — Telehealth (HOSPITAL_BASED_OUTPATIENT_CLINIC_OR_DEPARTMENT_OTHER): Payer: Self-pay | Admitting: Emergency Medicine

## 2015-03-05 ENCOUNTER — Ambulatory Visit (INDEPENDENT_AMBULATORY_CARE_PROVIDER_SITE_OTHER): Payer: Medicaid Other | Admitting: Family Medicine

## 2015-03-05 ENCOUNTER — Other Ambulatory Visit: Payer: Self-pay | Admitting: Pediatrics

## 2015-03-05 ENCOUNTER — Encounter: Payer: Self-pay | Admitting: Family Medicine

## 2015-03-05 VITALS — BP 101/49 | HR 66 | Temp 97.6°F | Resp 16 | Ht 70.0 in | Wt 124.0 lb

## 2015-03-05 DIAGNOSIS — F1210 Cannabis abuse, uncomplicated: Secondary | ICD-10-CM | POA: Diagnosis not present

## 2015-03-05 DIAGNOSIS — R636 Underweight: Secondary | ICD-10-CM

## 2015-03-05 DIAGNOSIS — R293 Abnormal posture: Secondary | ICD-10-CM | POA: Diagnosis not present

## 2015-03-05 DIAGNOSIS — Z8619 Personal history of other infectious and parasitic diseases: Secondary | ICD-10-CM

## 2015-03-05 DIAGNOSIS — G40309 Generalized idiopathic epilepsy and epileptic syndromes, not intractable, without status epilepticus: Secondary | ICD-10-CM | POA: Diagnosis not present

## 2015-03-05 DIAGNOSIS — Z23 Encounter for immunization: Secondary | ICD-10-CM | POA: Diagnosis not present

## 2015-03-05 DIAGNOSIS — F17200 Nicotine dependence, unspecified, uncomplicated: Secondary | ICD-10-CM

## 2015-03-05 DIAGNOSIS — F3131 Bipolar disorder, current episode depressed, mild: Secondary | ICD-10-CM

## 2015-03-05 LAB — POCT URINALYSIS DIP (DEVICE)
BILIRUBIN URINE: NEGATIVE
Glucose, UA: NEGATIVE mg/dL
HGB URINE DIPSTICK: NEGATIVE
KETONES UR: NEGATIVE mg/dL
Leukocytes, UA: NEGATIVE
NITRITE: NEGATIVE
PH: 6 (ref 5.0–8.0)
Protein, ur: NEGATIVE mg/dL
Specific Gravity, Urine: >=1.03 (ref 1.005–1.030)
Urobilinogen, UA: 0.2 mg/dL (ref 0.0–1.0)

## 2015-03-05 LAB — CBC WITH DIFFERENTIAL/PLATELET
BASOS PCT: 1 % (ref 0–1)
Basophils Absolute: 0.1 10*3/uL (ref 0.0–0.1)
Eosinophils Absolute: 0.2 10*3/uL (ref 0.0–0.7)
Eosinophils Relative: 2 % (ref 0–5)
HEMATOCRIT: 44 % (ref 39.0–52.0)
HEMOGLOBIN: 14.9 g/dL (ref 13.0–17.0)
LYMPHS PCT: 31 % (ref 12–46)
Lymphs Abs: 2.4 10*3/uL (ref 0.7–4.0)
MCH: 30.5 pg (ref 26.0–34.0)
MCHC: 33.9 g/dL (ref 30.0–36.0)
MCV: 90 fL (ref 78.0–100.0)
MONOS PCT: 7 % (ref 3–12)
MPV: 11.2 fL (ref 8.6–12.4)
Monocytes Absolute: 0.5 10*3/uL (ref 0.1–1.0)
NEUTROS ABS: 4.5 10*3/uL (ref 1.7–7.7)
NEUTROS PCT: 59 % (ref 43–77)
PLATELETS: 271 10*3/uL (ref 150–400)
RBC: 4.89 MIL/uL (ref 4.22–5.81)
RDW: 13.7 % (ref 11.5–15.5)
WBC: 7.6 10*3/uL (ref 4.0–10.5)

## 2015-03-05 NOTE — Patient Instructions (Addendum)
Cannabis Use Disorder Cannabis use disorder is a mental disorder. It is not one-time or occasional use of cannabis, more commonly known as marijuana. Cannabis use disorder is the continued, nonmedical use of cannabis that interferes with normal life activities or causes health problems. People with cannabis use disorder get a feeling of extreme pleasure and relaxation from cannabis use. This "high" is very rewarding and causes people to use over and over.  The mind-altering ingredient in cannabis is know as THC. THC can also interfere with motor coordination, memory, judgment, and accurate sense of space and time. These effects can last for a few days after using cannabis. Regular heavy cannabis use can cause long-lasting problems with thinking and learning. In young people, these problems may be permanent. Cannabis sometimes causes severe anxiety, paranoia, or visual hallucinations. Man-made (synthetic) cannabis-like drugs, such as "spice" and "K2," cause the same effects as THC but are much stronger. Cannabis-like drugs can cause dangerously high blood pressure and heart rate.  Cannabis use disorder usually starts in the teenage years. It can trigger the development of schizophrenia. It is somewhat more common in men than women. People who have family members with the disorder or existing mental health issues such as depression and posttraumatic stress disorderare more likely to develop cannabis use disorder. People with cannabis use disorder are at higher risk for use of other drugs of abuse.  SIGNS AND SYMPTOMS Signs and symptoms of cannabis use disorder include:   Use of cannabis in larger amounts or over a longer period than intended.   Unsuccessful attempts to cut down or control cannabis use.   A lot of time spent obtaining, using, or recovering from the effects of cannabis.   A strong desire or urge to use cannabis (cravings).   Continued use of cannabis in spite of problems at work,  school, or home because of use.   Continued use of cannabis in spite of relationship problems because of use.  Giving up or cutting down on important life activities because of cannabis use.  Use of cannabis over and over even in situations when it is physically hazardous, such as when driving a car.   Continued use of cannabis in spite of a physical problem that is likely related to use. Physical problems can include:  Chronic cough.  Bronchitis.  Emphysema.  Throat and lung cancer.  Continued use of cannabis in spite of a mental problem that is likely related to use. Mental problems can include:  Psychosis.  Anxiety.  Difficulty sleeping.  Need to use more and more cannabis to get the same effect, or lessened effect over time with use of the same amount (tolerance).  Having withdrawal symptoms when cannabis use is stopped, or using cannabis to reduce or avoid withdrawal symptoms. Withdrawal symptoms include:  Irritability or anger.  Anxiety or restlessness.  Difficulty sleeping.  Loss of appetite or weight.  Aches and pains.  Shakiness.  Sweating.  Chills. DIAGNOSIS Cannabis use disorder is diagnosed by your health care provider. You may be asked questions about your cannabis use and how it affects your life. A physical exam may be done. A drug screen may be done. You may be referred to a mental health professional. The diagnosis of cannabis use disorder requires at least two symptoms within 12 months. The type of cannabis use disorder you have depends on the number of symptoms you have. The type may be:  Mild. Two or three signs and symptoms.   Moderate. Four or   five signs and symptoms.   Severe. Six or more signs and symptoms.  TREATMENT Treatment is usually provided by mental health professionals with training in substance use disorders. The following options are available:  Counseling or talk therapy. Talk therapy addresses the reasons you use  cannabis. It also addresses ways to keep you from using again. The goals of talk therapy include:  Identifying and avoiding triggers for use.  Learning how to handle cravings.  Replacing use with healthy activities.  Support groups. Support groups provide emotional support, advice, and guidance.  Medicine. Medicine is used to treat mental health issues that trigger cannabis use or that result from it. HOME CARE INSTRUCTIONS  Take medicines only as directed by your health care provider.  Check with your health care provider before starting any new medicines.  Keep all follow-up visits as directed by your health care provider. SEEK MEDICAL CARE IF:  You are not able to take your medicines as directed.  Your symptoms get worse. SEEK IMMEDIATE MEDICAL CARE IF: You have serious thoughts about hurting yourself or others. FOR MORE INFORMATION  National Institute on Drug Abuse: http://www.price-smith.com/  Substance Abuse and Mental Health Services Administration: SkateOasis.com.pt   This information is not intended to replace advice given to you by your health care provider. Make sure you discuss any questions you have with your health care provider.   Document Released: 01/10/2000 Document Revised: 02/02/2014 Document Reviewed: 01/25/2013 Elsevier Interactive Patient Education 2016 [employer]. Sexually Transmitted Disease A sexually transmitted disease (STD) is a disease or infection often passed to another person during sex. However, STDs can be passed through nonsexual ways. An STD can be passed through:  Spit (saliva).  Semen.  Blood.  Mucus from the vagina.  Pee (urine). HOW CAN I LESSEN MY CHANCES OF GETTING AN STD?  Use:  Latex condoms.  Water-soluble lubricants with condoms. Do not use petroleum jelly or oils.  Dental dams. These are small pieces of latex that are used as a barrier during oral sex.  Avoid having more than one sex partner.  Do not have sex with  someone who has other sex partners.  Do not have sex with anyone you do not know or who is at high risk for an STD.  Avoid risky sex that can break your skin.  Do not have sex if you have open sores on your mouth or skin.  Avoid drinking too much alcohol or taking illegal drugs. Alcohol and drugs can affect your good judgment.  Avoid oral and anal sex acts.  Get shots (vaccines) for HPV and hepatitis.  If you are at risk of being infected with HIV, it is advised that you take a certain medicine daily to prevent HIV infection. This is called pre-exposure prophylaxis (PrEP). You may be at risk if:  You are a man who has sex with other men (MSM).  You are attracted to the opposite sex (heterosexual) and are having sex with more than one partner.  You take drugs with a needle.  You have sex with someone who has HIV.  Talk with your doctor about if you are at high risk of being infected with HIV. If you begin to take PrEP, get tested for HIV first. Get tested every 3 months for as long as you are taking PrEP.  Get tested for STDs every year if you are sexually active. If you are treated for an STD, get tested again 3 months after you are treated. WHAT SHOULD I  DO IF I THINK I HAVE AN STD?  See your doctor.  Tell your sex partner(s) that you have an STD. They should be tested and treated.  Do not have sex until your doctor says it is okay. WHEN SHOULD I GET HELP? Get help right away if:  You have bad belly (abdominal) pain.  You are a man and have puffiness (swelling) or pain in your testicles.  You are a woman and have puffiness in your vagina.   This information is not intended to replace advice given to you by your health care provider. Make sure you discuss any questions you have with your health care provider.   Document Released: 02/20/2004 Document Revised: 02/02/2014 Document Reviewed: 07/08/2012 Elsevier Interactive Patient Education 2016 [employer] Inc. Chlamydia,  Male Chlamydia is an infection. It is spread through sexual contact. Chlamydia can be in different areas of the body. These areas include the urethra, throat, or rectum. It is important to treat chlamydia as soon as possible. It can damage other organs.  CAUSES  Chlamydia is caused by bacteria. It is a sexually transmitted disease. This means that it is passed from an infected partner during intimate contact. This contact could be with the genitals, mouth, or rectal area.  SIGNS AND SYMPTOMS  There may not be any symptoms. This is often the case early in the infection. If there are symptoms, they are usually mild and may only be noticeable in the morning. Symptoms you may notice include:   Burning with urination.  Pain or swelling in the testicles.  Watery mucus-like discharge from the penis.  Long-standing (chronic) pelvic pain after frequent infections.  Pain, swelling, or itching around the anus.  A sore throat.  Itching, burning, or redness in the eyes, or discharge from the eyes. DIAGNOSIS  To diagnose this infection, your health care provider will do a pelvic exam. A sample of urine or a swab from the rectum may be taken for testing.  TREATMENT  Chlamydia is treated with antibiotic medicines. Your health care provider may test you for infection again 3 months after treatment. HOME CARE INSTRUCTIONS  Take your antibiotic medicine as directed by your health care provider. Finish the antibiotic even if you start to feel better. Incomplete treatment will put you at risk for not being able to have children (sterility).   Take medicines only as directed by your health care provider.   Rest.   Inform any sexual partners about your infection. Even if they are symptom free or have a negative culture or evaluation, they should be treated for the condition.   Do not have sex (intercourse) until treatment is completed and your health care provider says it is okay.   Keep all  follow-up visits as directed by your health care provider.   Not all test results are available during your visit. If your test results are not back during the visit, make an appointment with your health care provider to find out the results. Do not assume everything is normal if you have not heard from your health care provider or the medical facility. It is your responsibility to get your test results. SEEK MEDICAL CARE IF:  You develop new joint pain.  You have a fever. SEEK IMMEDIATE MEDICAL CARE IF:   Your pain increases.   You have abnormal discharge.   You have pain during intercourse. MAKE SURE YOU:   Understand these instructions.  Will watch your condition.  Will get help right away if you  are not doing well or get worse.   This information is not intended to replace advice given to you by your health care provider. Make sure you discuss any questions you have with your health care provider.   Document Released: 01/12/2005 Document Revised: 02/02/2014 Document Reviewed: 07/21/2012 Elsevier Interactive Patient Education 2016 Elsevier Inc. Tobacco Use Disorder Tobacco use disorder (TUD) is a mental disorder. It is the long-term use of tobacco in spite of related health problems or difficulty with normal life activities. Tobacco is most commonly smoked as cigarettes and less commonly as cigars or pipes. Smokeless chewing tobacco and snuff are also popular. People with TUD get a feeling of extreme pleasure (euphoria) from using tobacco and have a desire to use it again and again. Repeated use of tobacco can cause problems. The addictive effects of tobacco are due mainly tothe ingredient nicotine. Nicotine also causes a rush of adrenaline (epinephrine) in the body. This leads to increased blood pressure, heart rate, and breathing rate. These changes may cause problems for people with high blood pressure, weak hearts, or lung disease. High doses of nicotine in children and pets  can lead to seizures and death.  Tobacco contains a number of other unsafe chemicals. These chemicals are especially harmful when inhaled as smoke and can damage almost every organ in the body. Smokers live shorter lives than nonsmokers and are at risk of dying from a number of diseases and cancers. Tobacco smoke can also cause health problems for nonsmokers (due to inhaling secondhand smoke). Smoking is also a fire hazard.  TUD usually starts in the late teenage years and is most common in young adults between the ages of 21 and 25 years. People who start smoking earlier in life are more likely to continue smoking as adults. TUD is somewhat more common in men than women. People with TUD are at higher risk for using alcohol and other drugs of abuse. RISK FACTORS Risk factors for TUD include:   Having family members with the disorder.  Being around people who use tobacco.  Having an existing mental health issue such as schizophrenia, depression, bipolar disorder, ADHD, or posttraumatic stress disorder (PTSD). SIGNS AND SYMPTOMS  People with tobacco use disorder have two or more of the following signs and symptoms within 12 months:   Use of more tobacco over a longer period than intended.   Not able to cut down or control tobacco use.   A lot of time spent obtaining or using tobacco.   Strong desire or urge to use tobacco (craving). Cravings may last for 6 months or longer after quitting.  Use of tobacco even when use leads to major problems at work, school, or home.   Use of tobacco even when use leads to relationship problems.   Giving up or cutting down on important life activities because of tobacco use.   Repeatedly using tobacco in situations where it puts you or others in physical danger, like smoking in bed.   Use of tobacco even when it is known that a physical or mental problem is likely related to tobacco use.   Physical problems are numerous and may include chronic  bronchitis, emphysema, lung and other cancers, gum disease, high blood pressure, heart disease, and stroke.   Mental problems caused by tobacco may include difficulty sleeping and anxiety.  Need to use greater amounts of tobacco to get the same effect. This means you have developed a tolerance.   Withdrawal symptoms as a result of  stopping or rapidly cutting back use. These symptoms may last a month or more after quitting and include the following:   Depressed, anxious, or irritable mood.   Difficulty concentrating.   Increased appetite.  Restlessness or trouble sleeping.   Use of tobacco to avoid withdrawal symptoms. DIAGNOSIS  Tobacco use disorder is diagnosed by your health care provider. A diagnosis may be made by:  Your health care provider asking questions about your tobacco use and any problems it may be causing.  A physical exam.  Lab tests.  You may be referred to a mental health professional or addiction specialist. The severity of tobacco use disorder depends on the number of signs and symptoms you have:   Mild--Two or three symptoms.  Moderate--Four or five symptoms.   Severe--Six or more symptoms.  TREATMENT  Many people with tobacco use disorder are unable to quit on their own and need help. Treatment options include the following:  Nicotine replacement therapy (NRT). NRT provides nicotine without the other harmful chemicals in tobacco. NRT gradually lowers the dosage of nicotine in the body and reduces withdrawal symptoms. NRT is available in over-the-counter forms (gum, lozenges, and skin patches) as well as prescription forms (mouth inhaler and nasal spray).  Medicines.This may include:  Antidepressant medicine that may reduce nicotine cravings.  A medicine that acts on nicotine receptors in the brain to reduce cravings and withdrawal symptoms. It may also block the effects of tobacco in people with TUD who relapse.  Counseling or talk therapy.  A form of talk therapy called behavioral therapy is commonly used to treat people with TUD. Behavioral therapy looks at triggers for tobacco use, how to avoid them, and how to cope with cravings. It is most effective in person or by phone but is also available in self-help forms (books and Internet websites).  Support groups. These provide emotional support, advice, and guidance for quitting tobacco. The most effective treatment for TUD is usually a combination of medicine, talk therapy, and support groups. HOME CARE INSTRUCTIONS  Keep all follow-up visits as directed by your health care provider. This is important.  Take medicines only as directed by your health care provider.  Check with your health care provider before starting new prescription or over-the-counter medicines. SEEK MEDICAL CARE IF:  You are not able to take your medicines as prescribed.  Treatment is not helping your TUD and your symptoms get worse. SEEK IMMEDIATE MEDICAL CARE IF:  You have serious thoughts about hurting yourself or others.  You have trouble breathing, chest pain, sudden weakness, or sudden numbness in part of your body.   This information is not intended to replace advice given to you by your health care provider. Make sure you discuss any questions you have with your health care provider.   Document Released: 09/18/2003 Document Revised: 02/02/2014 Document Reviewed: 03/10/2013 Elsevier Interactive Patient Education [employer]

## 2015-03-05 NOTE — Progress Notes (Signed)
Subjective:    Patient ID: Andrew Everett, male    DOB: 06-26-1995, 20 y.o.   MRN: [id number]  HPI Andrew Everett, a 20 year old with a history of generalized convulsive seizures presents accompanied by mother to establish care. He was previously followed by Andrew [facility]rett, but has aged out of the pediatric practice. He is currently living with is mother and he has a daughter that he sees several times per week. Patient appears withdrawn and his mother is answering the majority of questions. He maintains that he has not had a seizure over the past several years. He was last seen by [facility] around 1 year ago. He has been taking seizure medications consistently and tolerating medication well.  Patient is a chronic everyday smoker. He states that he has been smoking tobacco since age 64. He says that he smokes up to a pack per day. He states that he also smokes marijuana. He denies using other illicit drug use.   He currently denies headache, fever, shortness of breath, chest pains, back pain, dysuria, nausea, vomiting, or diarrhea.   Past Medical History  Diagnosis Date  . Epilepsy (HCC)   . Bipolar disorder (HCC)   . Vomiting   . Constipation   . Seizure disorder (HCC)   . Tics of organic origin     Social History   Social History  . Marital Status: Single    Spouse Name: N/A  . Number of Children: N/A  . Years of Education: N/A   Occupational History  . Not on file.   Social History Main Topics  . Smoking status: Current Every Day Smoker -- 0.50 packs/day    Types: Cigarettes  . Smokeless tobacco: Never Used     Comment: Patient smokes 4 cigaretts daily  . Alcohol Use: 9.6 oz/week    16 Standard drinks or equivalent per week     Comment: Patient had 1 alcoholic beverage in Jan. 2015.  . Drug Use: No     Comment: Prescription Cold Tablets- Patient stated he no longer takes cold tabs as a means of getting high. He admitted to smoking Marijuana once in Jan. 2015  . Sexual  Activity:    Partners: Female    Copy: None   Other Topics Concern  . Not on file   Social History Narrative   Review of Systems  Constitutional: Negative.   HENT: Negative.   Eyes: Negative.   Respiratory: Negative.   Cardiovascular: Negative.   Gastrointestinal: Negative.   Endocrine: Negative.  Negative for polydipsia, polyphagia and polyuria.  Musculoskeletal: Negative.   Skin: Negative.   Allergic/Immunologic: Negative.   Neurological: Negative.   Hematological: Negative.   Psychiatric/Behavioral: Positive for behavioral problems and decreased concentration.       Objective:   Physical Exam  Constitutional: He is oriented to person, place, and time. He appears well-developed and well-nourished. No distress.  HENT:  Head: Normocephalic and atraumatic.  Right Ear: External ear normal.  Left Ear: External ear normal.  Nose: Nose normal.  Mouth/Throat: Oropharynx is clear and moist.  Eyes: Conjunctivae and EOM are normal. Pupils are equal, round, and reactive to light.  Neck: Normal range of motion. Neck supple.  Cardiovascular: Normal rate, normal heart sounds and intact distal pulses.   Pulmonary/Chest: Effort normal and breath sounds normal.  Abdominal: Soft. Bowel sounds are normal.  Musculoskeletal: Normal range of motion.  Lymphadenopathy:       Head (right side): No submental  adenopathy present.       Head (left side): No submental adenopathy present.  Neurological: He is alert and oriented to person, place, and time. He has normal reflexes.  Skin: Skin is warm, dry and intact.  Psychiatric: He has a normal mood and affect. Judgment and thought content normal. He is withdrawn. He is inattentive.     BP 101/49 mmHg  Pulse 66  Temp(Src) 97.6 F (36.4 C) (Oral)  Resp 16  Ht  (1.778 m)  Wt 124 lb (56.246 kg)  BMI 17.79 kg/m2 Assessment & Plan:  1. Generalized convulsive epilepsy [facility]) Patient is under the care of Andrew Everett, [facility]. The last known seizure was in January 2014. He says that he has been taking medications consistently. Recommend that he schedule an appointment to f/u with Andrew Everett.    2. Poor posture Patient is slouching, tilting head forward, and arching the upper and lower back. Checked spine (no scoliosis or kyphosis on physical exam) - Vitamin D, 25-hydroxy  3. Marijuana abuse Discussed marijuana abuse at length. Patient states that he is not ready to discontinue use.   4. Tobacco dependence Smoking cessation instruction/counseling given:  counseled patient on the dangers of tobacco use, advised patient to stop smoking, and reviewed strategies to maximize success  5. History of sexually transmitted disease Patient was treated for chlamydia 2 months ago. Recommend barrier protection with sexual intercourse.   6. Immunization due  - Pneumococcal polysaccharide vaccine 23-valent greater than or equal to 2yo subcutaneous/IM  7. Underweight Patient's weight has been fluctuating, current BMI is 17.1. Will check baseline labs - CBC with Differential - COMPLETE METABOLIC PANEL WITH GFR -TSH 8. Bipolar Disorder Patient appears withdrawn and has a hx of bipolar disorder and depression. He denies symptoms of depression, suicidal or homicidal ideation. He states, " I grew out of that". He says that he is not under the care of a psychiatrist. I recommend that he follows up with [facility].     Routine Health Maintenance:  Recommend barrier protection Yearly Dental Visit Yearly eye exam  RTC: 6 months for annual physical exam  Andrew Everett M, FNP    The patient was given clear instructions to go to ER or return to medical center if symptoms do not improve, worsen or new problems develop. The patient verbalized understanding. Will notify patient with laboratory results.

## 2015-03-06 ENCOUNTER — Other Ambulatory Visit: Payer: Self-pay | Admitting: Family Medicine

## 2015-03-06 DIAGNOSIS — E559 Vitamin D deficiency, unspecified: Secondary | ICD-10-CM | POA: Insufficient documentation

## 2015-03-06 LAB — COMPLETE METABOLIC PANEL WITH GFR
ALT: 10 U/L (ref 9–46)
AST: 14 U/L (ref 10–40)
Albumin: 4.1 g/dL (ref 3.6–5.1)
Alkaline Phosphatase: 91 U/L (ref 40–115)
BILIRUBIN TOTAL: 0.4 mg/dL (ref 0.2–1.2)
BUN: 8 mg/dL (ref 7–25)
CO2: 28 mmol/L (ref 20–31)
Calcium: 8.9 mg/dL (ref 8.6–10.3)
Chloride: 103 mmol/L (ref 98–110)
Creat: 0.88 mg/dL (ref 0.60–1.35)
GFR, Est African American: >89 mL/min (ref >=60)
GLUCOSE: 74 mg/dL (ref 65–99)
Potassium: 4.2 mmol/L (ref 3.5–5.3)
SODIUM: 140 mmol/L (ref 135–146)
TOTAL PROTEIN: 6.6 g/dL (ref 6.1–8.1)

## 2015-03-06 LAB — VITAMIN D 25 HYDROXY (VIT D DEFICIENCY, FRACTURES): VIT D 25 HYDROXY: 17 ng/mL — AB (ref 30–100)

## 2015-03-06 LAB — TSH: TSH: 1.65 mIU/L (ref 0.40–4.50)

## 2015-03-06 MED ORDER — ERGOCALCIFEROL 1.25 MG (50000 UT) PO CAPS: 50000.0000 [IU] | ORAL_CAPSULE | ORAL | Status: AC

## 2015-03-06 NOTE — Progress Notes (Signed)
Called and left message for patient to call back when able regarding labs. Thanks!

## 2015-03-07 DIAGNOSIS — F17200 Nicotine dependence, unspecified, uncomplicated: Secondary | ICD-10-CM | POA: Insufficient documentation

## 2015-03-07 NOTE — Progress Notes (Signed)
Called and spoke to patients mother (with patients permission) advised of vitamin D levels and the need to take vitamin D once weekly, and appointment was scheduled for patient in 6 months for vitamin D re-check. Thanks!

## 2015-05-02 ENCOUNTER — Emergency Department (HOSPITAL_COMMUNITY)
Admission: EM | Admit: 2015-05-02 | Discharge: 2015-05-02 | Disposition: A | Discharge status: Home / Self Care | Payer: Medicaid Other | Attending: Emergency Medicine | Admitting: Emergency Medicine

## 2015-05-02 ENCOUNTER — Encounter (HOSPITAL_COMMUNITY): Payer: Self-pay | Admitting: Adult Health

## 2015-05-02 ENCOUNTER — Emergency Department (HOSPITAL_COMMUNITY): Payer: Medicaid Other

## 2015-05-02 DIAGNOSIS — F319 Bipolar disorder, unspecified: Secondary | ICD-10-CM | POA: Insufficient documentation

## 2015-05-02 DIAGNOSIS — S52611A Displaced fracture of right ulna styloid process, initial encounter for closed fracture: Secondary | ICD-10-CM

## 2015-05-02 DIAGNOSIS — G40909 Epilepsy, unspecified, not intractable, without status epilepticus: Secondary | ICD-10-CM | POA: Diagnosis not present

## 2015-05-02 DIAGNOSIS — F17210 Nicotine dependence, cigarettes, uncomplicated: Secondary | ICD-10-CM | POA: Diagnosis not present

## 2015-05-02 DIAGNOSIS — S6991XA Unspecified injury of right wrist, hand and finger(s), initial encounter: Secondary | ICD-10-CM | POA: Diagnosis present

## 2015-05-02 DIAGNOSIS — W108XXA Fall (on) (from) other stairs and steps, initial encounter: Secondary | ICD-10-CM | POA: Diagnosis not present

## 2015-05-02 DIAGNOSIS — Y9389 Activity, other specified: Secondary | ICD-10-CM | POA: Insufficient documentation

## 2015-05-02 DIAGNOSIS — H6091 Unspecified otitis externa, right ear: Secondary | ICD-10-CM | POA: Diagnosis not present

## 2015-05-02 DIAGNOSIS — Z8719 Personal history of other diseases of the digestive system: Secondary | ICD-10-CM | POA: Insufficient documentation

## 2015-05-02 DIAGNOSIS — Y998 Other external cause status: Secondary | ICD-10-CM | POA: Diagnosis not present

## 2015-05-02 DIAGNOSIS — Z79899 Other long term (current) drug therapy: Secondary | ICD-10-CM | POA: Diagnosis not present

## 2015-05-02 DIAGNOSIS — Y9289 Other specified places as the place of occurrence of the external cause: Secondary | ICD-10-CM | POA: Insufficient documentation

## 2015-05-02 MED ORDER — IBUPROFEN 400 MG PO TABS
800.0000 mg | ORAL_TABLET | Freq: Once | ORAL | Status: AC
  Administered 2015-05-02: 800 mg via ORAL
  Filled 2015-05-02: qty 2

## 2015-05-02 MED ORDER — CIPROFLOXACIN-DEXAMETHASONE 0.3-0.1 % OT SUSP
4.0000 [drp] | Freq: Once | OTIC | Status: AC
  Administered 2015-05-02: 4 [drp] via OTIC
  Filled 2015-05-02: qty 7.5

## 2015-05-02 NOTE — ED Notes (Signed)
Presents with right ear pain began 2 days ago assocaited with bloody drainage. Also c/o right wrist and forearm pain that occurred after falling yesterday. Right ear hurts worse than wrist.

## 2015-05-02 NOTE — ED Notes (Signed)
E-signature pad in room not working.  Patient verbalized understanding of all discharge instructions, all questions answered.

## 2015-05-02 NOTE — ED Provider Notes (Signed)
CSN: [id number]     Arrival date & time 05/02/15  0007 History   First MD Initiated Contact with Patient 05/02/15 0048     Chief Complaint  Patient presents with  . Wrist Injury  . Otalgia    HPI   20 year old male presents today with complaints of wrist pain and ear pain. Patient reports that he fell down some stairs yesterday landing on his hand causing immediate wrist pain. Patient also notes that he's had discharge from his right ear starting approximately around the same time. Patient reports pain with palpation of the external ear, denies any loss of hearing out of the ear. Patient denies any head trauma from the fall. Patient denies any surrounding erythema, warmth to touch, or any signs of soft tissue infection.    Past Medical History  Diagnosis Date  . Epilepsy (HCC)   . Bipolar disorder (HCC)   . Vomiting   . Constipation   . Seizure disorder (HCC)   . Tics of organic origin    Past Surgical History  Procedure Laterality Date  . Circumcision    . Wrist surgery Left 2013    Repair left broken wrist   Family History  Problem Relation Age of Onset  . Bipolar disorder Mother   . Diabetes Maternal Grandmother   . Hepatitis C Maternal Grandmother   . COPD Maternal Grandmother    Social History  Substance Use Topics  . Smoking status: Current Every Day Smoker -- 0.50 packs/day    Types: Cigarettes  . Smokeless tobacco: Never Used     Comment: Patient smokes 4 cigaretts daily  . Alcohol Use: 9.6 oz/week    16 Standard drinks or equivalent per week     Comment: Patient had 1 alcoholic beverage in Jan. 2015.    Review of Systems  All other systems reviewed and are negative.   Allergies  Aripiprazole; Risperdal; and Seroquel  Home Medications   Prior to Admission medications   Medication Sig Start Date End Date Taking? Authorizing Provider  ergocalciferol (VITAMIN D2) 50000 units capsule Take 1 capsule (50,000 Units total) by mouth once a week. 03/06/15    Massie Maroon, FNP  lamoTRIgine (LAMICTAL) 100 MG tablet Take 2 tablets (200 mg total) by mouth 2 (two) times daily. 03/16/14   Deetta Perla, MD  levETIRAcetam (KEPPRA) 1000 MG tablet TAKE 2 TABLETS BY MOUTH TWICE A DAY 03/05/15   Deetta Perla, MD  promethazine (PHENERGAN) 25 MG tablet Take 1 tablet (25 mg total) by mouth every 6 (six) hours as needed for nausea or vomiting. 01/05/15   Kristen N Ward, DO   BP 128/78 mmHg  Pulse 105  Temp(Src) 98.7 F (37.1 C) (Oral)  Resp 15  Wt 52.617 kg  SpO2 100%   Physical Exam  Constitutional: He is oriented to person, place, and time. He appears well-developed and well-nourished.  HENT:  Head: Normocephalic and atraumatic.  Right Ear: Hearing normal. There is swelling and tenderness.  Left Ear: Hearing and tympanic membrane normal.  Otitis externa right ear. It tender to palpation of the mastoid, no signs of surrounding infection  Eyes: Conjunctivae are normal. Pupils are equal, round, and reactive to light. Right eye exhibits no discharge. Left eye exhibits no discharge. No scleral icterus.  Neck: Normal range of motion. No JVD present. No tracheal deviation present.  Pulmonary/Chest: Effort normal. No stridor.  Musculoskeletal:  Minor swelling to the ulnar styloid, tenderness to palpation. Full active range of motion of  the wrist, significant pain with wrist extension, distal sensation strength and motor function intact  Neurological: He is alert and oriented to person, place, and time. Coordination normal.  Psychiatric: He has a normal mood and affect. His behavior is normal. Judgment and thought content normal.  Nursing note and vitals reviewed.   ED Course  Procedures (including critical care time) Labs Review Labs Reviewed - No data to display  Imaging Review Dg Wrist Complete Right  05/02/2015  CLINICAL DATA:  Fall yesterday with wrist pain. EXAM: RIGHT WRIST - COMPLETE 3+ VIEW COMPARISON:  None. FINDINGS: Minimally  displaced ulna styloid fracture. No fracture of the distal radius. The alignment is maintained. Scaphoid is intact. Ulnar soft tissue edema. IMPRESSION: Minimally displaced ulna styloid fracture. No associated radial fracture. Electronically Signed   By: Rubye OaksMelanie  Ehinger M.D.   On: 05/02/2015 00:35   I have personally reviewed and evaluated these images and lab results as part of my medical decision-making.   EKG Interpretation None      MDM   Final diagnoses:  Ulna styloid fracture, closed, right, initial encounter  Otitis externa, right    Labs:  Imaging:  Consults:  Therapeutics:  Discharge Meds:   Assessment/Plan: 20 year old male presents today status post fall. Patient is very vague on the fall and surrounding circumstances. He has bruises to the knuckles, hand, with ulnar styloid fracture. Patient will be placed in a sugar tong, encouraged follow-up with orthopedics this week for reevaluation and further management. Patient also has otitis externa, patient will be given above medication, encouraged follow-up with his primary care provider for reevaluation. Patient given strict return cautioned, he verbalized understanding and agreement today's plan had no further questions concerns at the time discharge.         Eyvonne MechanicJeffrey Taiven Greenley, PA-C 05/02/15 0120  Eyvonne MechanicJeffrey Kerianna Rawlinson, PA-C 05/02/15 [phone]  Tomasita CrumbleAdeleke Oni, MD 05/02/15 [phone]

## 2015-05-02 NOTE — ED Notes (Signed)
Patient able to ambulate independently  

## 2015-05-02 NOTE — ED Notes (Signed)
Ortho at bedside applying sugar tong splint

## 2015-05-02 NOTE — Progress Notes (Signed)
Orthopedic Tech Progress Note Patient Details:  Andrew Everett 03/13/1995 [id number]  Ortho Devices Type of Ortho Device: Arm sling, Sugartong splint Ortho Device/Splint Location: rue Ortho Device/Splint Interventions: Ordered, Application   Trinna PostMartinez, Shaquil Aldana J 05/02/2015, 1:44 AM

## 2015-06-03 ENCOUNTER — Encounter (HOSPITAL_COMMUNITY): Payer: Self-pay | Admitting: Emergency Medicine

## 2015-06-03 ENCOUNTER — Emergency Department (HOSPITAL_COMMUNITY)
Admission: EM | Admit: 2015-06-03 | Discharge: 2015-06-03 | Disposition: A | Discharge status: Home / Self Care | Payer: Medicaid Other | Attending: Emergency Medicine | Admitting: Emergency Medicine

## 2015-06-03 DIAGNOSIS — R569 Unspecified convulsions: Secondary | ICD-10-CM

## 2015-06-03 DIAGNOSIS — F319 Bipolar disorder, unspecified: Secondary | ICD-10-CM | POA: Insufficient documentation

## 2015-06-03 DIAGNOSIS — Z79899 Other long term (current) drug therapy: Secondary | ICD-10-CM | POA: Insufficient documentation

## 2015-06-03 DIAGNOSIS — F17210 Nicotine dependence, cigarettes, uncomplicated: Secondary | ICD-10-CM | POA: Insufficient documentation

## 2015-06-03 DIAGNOSIS — G40909 Epilepsy, unspecified, not intractable, without status epilepticus: Secondary | ICD-10-CM | POA: Insufficient documentation

## 2015-06-03 DIAGNOSIS — Z9114 Patient's other noncompliance with medication regimen: Secondary | ICD-10-CM

## 2015-06-03 MED ORDER — LAMOTRIGINE 200 MG PO TABS
200.0000 mg | ORAL_TABLET | ORAL | Status: AC
  Administered 2015-06-03: 200 mg via ORAL
  Filled 2015-06-03: qty 1

## 2015-06-03 MED ORDER — ACETAMINOPHEN 325 MG PO TABS
650.0000 mg | ORAL_TABLET | Freq: Once | ORAL | Status: AC
  Administered 2015-06-03: 650 mg via ORAL
  Filled 2015-06-03: qty 2

## 2015-06-03 MED ORDER — LEVETIRACETAM 750 MG PO TABS
2000.0000 mg | ORAL_TABLET | Freq: Once | ORAL | Status: AC
  Administered 2015-06-03: 2000 mg via ORAL
  Filled 2015-06-03: qty 1

## 2015-06-03 NOTE — Discharge Instructions (Signed)

## 2015-06-03 NOTE — ED Provider Notes (Signed)
CSN: [id number]     Arrival date & time 06/03/15  0530 History   First MD Initiated Contact with Patient 06/03/15 [phone]     Chief Complaint  Patient presents with  . Seizures      HPI Patient presents to the emergency department after a witnessed seizure by his roommate.  He vomited once was given 4 mg of IV Zofran by EMS.  He does admit to drinking alcohol tonight.  He reports mild headache at this time.  He is supposed to be on Keppra and Lamictal for his seizure disorder.  Patient reports he is noncompliant with his medications because they affect his appetite.  He denies weakness of his arm and legs.  No recent illness.   Past Medical History  Diagnosis Date  . Epilepsy (HCC)   . Bipolar disorder (HCC)   . Vomiting   . Constipation   . Seizure disorder (HCC)   . Tics of organic origin    Past Surgical History  Procedure Laterality Date  . Circumcision    . Wrist surgery Left 2013    Repair left broken wrist   Family History  Problem Relation Age of Onset  . Bipolar disorder Mother   . Diabetes Maternal Grandmother   . Hepatitis C Maternal Grandmother   . COPD Maternal Grandmother    Social History  Substance Use Topics  . Smoking status: Current Every Day Smoker -- 0.50 packs/day    Types: Cigarettes  . Smokeless tobacco: Never Used     Comment: Patient smokes 4 cigaretts daily  . Alcohol Use: 9.6 oz/week    16 Standard drinks or equivalent per week     Comment: Patient had 1 alcoholic beverage in Jan. 2015.    Review of Systems  All other systems reviewed and are negative.     Allergies  Aripiprazole; Risperdal; and Seroquel  Home Medications   Prior to Admission medications   Medication Sig Start Date End Date Taking? Authorizing Provider  levETIRAcetam (KEPPRA) 1000 MG tablet TAKE 2 TABLETS BY MOUTH TWICE A DAY 03/05/15  Yes Deetta PerlaWilliam H Hickling, MD  ergocalciferol (VITAMIN D2) 50000 units capsule Take 1 capsule (50,000 Units total) by mouth once a week.  03/06/15   Massie MaroonLachina M Hollis, FNP  lamoTRIgine (LAMICTAL) 100 MG tablet Take 2 tablets (200 mg total) by mouth 2 (two) times daily. 03/16/14   Deetta PerlaWilliam H Hickling, MD  promethazine (PHENERGAN) 25 MG tablet Take 1 tablet (25 mg total) by mouth every 6 (six) hours as needed for nausea or vomiting. 01/05/15   Kristen N Ward, DO   BP 130/79 mmHg  Pulse 88  Temp(Src) 98.1 F (36.7 C) (Oral)  Resp 18  SpO2 98% Physical Exam  Constitutional: He is oriented to person, place, and time. He appears well-developed and well-nourished.  HENT:  Head: Normocephalic and atraumatic.  Eyes: EOM are normal. Pupils are equal, round, and reactive to light.  Neck: Normal range of motion.  Cardiovascular: Normal rate, regular rhythm and normal heart sounds.   Pulmonary/Chest: Effort normal and breath sounds normal. No respiratory distress.  Abdominal: Soft. He exhibits no distension. There is no tenderness.  Musculoskeletal: Normal range of motion.  Neurological: He is alert and oriented to person, place, and time.  5/5 strength in major muscle groups of  bilateral upper and lower extremities. Speech normal. No facial asymetry.   Skin: Skin is warm and dry.  Psychiatric: He has a normal mood and affect. Judgment normal.  Nursing note  and vitals reviewed.   ED Course  Procedures (including critical care time) Labs Review Labs Reviewed - No data to display  Imaging Review No results found. I have personally reviewed and evaluated these images and lab results as part of my medical decision-making.   EKG Interpretation None      MDM   Final diagnoses:  None    Patient be given his home dose medications.  Instructed to follow-up with his neurologist.    Azalia Bilis, MD 06/03/15 [phone]

## 2015-06-03 NOTE — ED Notes (Signed)
Pt brought in by EMS after a friend heard him scream out and went to check on him and found him in the floor having a seizure that lasted approximately 2 minutes in duration  Pt has hx of seizures and did not take his medication yesterday  Pt was postictal upon EMS arrival but by the time he got to the hospital he is alert and oriented x 3  Pt vomited x 1 and was given Zofran 4mg  IV with some relief  Pt's last seizure was 4 days ago but was not seen at that time  Pt is c/o headache upon arrival to ED

## 2015-06-15 ENCOUNTER — Other Ambulatory Visit: Payer: Self-pay | Admitting: Pediatrics

## 2015-07-23 ENCOUNTER — Encounter: Payer: Self-pay | Admitting: Pediatrics

## 2015-07-23 ENCOUNTER — Other Ambulatory Visit: Payer: Self-pay | Admitting: Family

## 2015-07-23 ENCOUNTER — Encounter: Payer: Self-pay | Admitting: Family

## 2015-09-02 ENCOUNTER — Other Ambulatory Visit: Payer: Self-pay | Admitting: Family

## 2015-09-06 ENCOUNTER — Other Ambulatory Visit: Payer: Self-pay

## 2016-03-04 ENCOUNTER — Ambulatory Visit: Payer: Self-pay | Admitting: Family Medicine
